# Patient Record
Sex: Male | Born: 1960 | Race: White | Hispanic: No | Marital: Single
Health system: Southern US, Community
[De-identification: ages and names within clinical notes are randomized; demographics above are authoritative.]

---

## 2019-08-09 ENCOUNTER — Emergency Department
Admission: EM | Admit: 2019-08-09 | Discharge: 2019-08-09 | Disposition: A | Discharge status: Home / Self Care | Payer: Medicare Other | Attending: Emergency Medicine | Admitting: Emergency Medicine

## 2019-08-09 ENCOUNTER — Encounter: Payer: Self-pay | Admitting: Emergency Medicine

## 2019-08-09 ENCOUNTER — Other Ambulatory Visit: Payer: Self-pay

## 2019-08-09 DIAGNOSIS — S21152A Open bite of left front wall of thorax without penetration into thoracic cavity, initial encounter: Secondary | ICD-10-CM | POA: Diagnosis not present

## 2019-08-09 DIAGNOSIS — Z23 Encounter for immunization: Secondary | ICD-10-CM | POA: Insufficient documentation

## 2019-08-09 DIAGNOSIS — Z203 Contact with and (suspected) exposure to rabies: Secondary | ICD-10-CM

## 2019-08-09 DIAGNOSIS — Y929 Unspecified place or not applicable: Secondary | ICD-10-CM | POA: Diagnosis not present

## 2019-08-09 DIAGNOSIS — Y999 Unspecified external cause status: Secondary | ICD-10-CM | POA: Diagnosis not present

## 2019-08-09 DIAGNOSIS — Z2914 Encounter for prophylactic rabies immune globin: Secondary | ICD-10-CM | POA: Insufficient documentation

## 2019-08-09 DIAGNOSIS — Y939 Activity, unspecified: Secondary | ICD-10-CM | POA: Diagnosis not present

## 2019-08-09 DIAGNOSIS — W540XXA Bitten by dog, initial encounter: Secondary | ICD-10-CM | POA: Diagnosis not present

## 2019-08-09 MED ORDER — RABIES VACCINE, PCEC IM SUSR
1.0000 mL | Freq: Once | INTRAMUSCULAR | Status: AC
  Administered 2019-08-09: 1 mL via INTRAMUSCULAR

## 2019-08-09 MED ORDER — RABIES IMMUNE GLOBULIN 150 UNIT/ML IM INJ
20.0000 [IU]/kg | INJECTION | Freq: Once | INTRAMUSCULAR | Status: AC
  Administered 2019-08-09: 2400 [IU] via INTRAMUSCULAR
  Filled 2019-08-09: qty 16

## 2019-08-09 MED ORDER — AMOXICILLIN-POT CLAVULANATE 875-125 MG PO TABS: 1.0000 | ORAL_TABLET | Freq: Two times a day (BID) | ORAL | 0 refills | Status: AC

## 2019-08-09 NOTE — ED Provider Notes (Signed)
[facility] Emergency Department Provider Note  ____________________________________________  Time seen: Approximately 7:36 PM  I have reviewed the triage vital signs and the nursing notes.   HISTORY  Chief Complaint Animal Bite    HPI Stephen Bray is a 58 y.o. male who presents the emergency department after being bitten by rabid dog yesterday.  Patient was informed by the health department and animal control that an animal that he was in contact with yesterday tested positive for rabies.  Patient states that roughly 30 years ago he had the rabies vaccine but he had had titers drawn in the interim and revealed no residual antibodies.  As such patient will require rabies series including immunoglobin today.  Tetanus shot is up-to-date.  Patient will be started on rabies series today.  Patient was bitten to the left anterior chest wall with superficial laceration.  Patient denies any evidence of infection to the chest wall         History reviewed. No pertinent past medical history.  There are no problems to display for this patient.   History reviewed. No pertinent surgical history.  Prior to Admission medications   Medication Sig Start Date End Date Taking? Authorizing Provider  amoxicillin-clavulanate (AUGMENTIN) 875-125 MG tablet Take 1 tablet by mouth 2 (two) times daily. 08/09/19   Jentry Mcqueary, Charline Bills, PA-C    Allergies Patient has no allergy information on record.  No family history on file.  Social History Social History   Tobacco Use  . Smoking status: Not on file  Substance Use Topics  . Alcohol use: Not on file  . Drug use: Not on file     Review of Systems  Constitutional: No fever/chills Eyes: No visual changes. No discharge ENT: No upper respiratory complaints. Cardiovascular: no chest pain. Respiratory: no cough. No SOB. Gastrointestinal: No abdominal pain.  No nausea, no vomiting.  No diarrhea.  No  constipation. Musculoskeletal: No musculoskeletal pain Skin:  Bite to the anterior left chest wall.  No surrounding erythema or edema.  Superficial abrasions noted.  No open laceration.  No bleeding or foreign body.  No tenderness to palpation of the underlying structure Neurological: Negative for headaches, focal weakness or numbness. 10-point ROS otherwise negative.  ____________________________________________   PHYSICAL EXAM:  VITAL SIGNS: ED Triage Vitals  Enc Vitals Group     BP 08/09/19 1851 (!) 205/110     Pulse Rate 08/09/19 1851 84     Resp 08/09/19 1851 20     Temp 08/09/19 1851 99.2 F (37.3 C)     Temp Source 08/09/19 1851 Oral     SpO2 08/09/19 1851 98 %     Weight 08/09/19 1833 263 lb (119.3 kg)     Height 08/09/19 1833 6' (1.829 m)     Head Circumference --      Peak Flow --      Pain Score 08/09/19 1833 0     Pain Loc --      Pain Edu? --      Excl. in [place]? --      Constitutional: Alert and oriented. Well appearing and in no acute distress. Eyes: Conjunctivae are normal. PERRL. EOMI. Head: Atraumatic. ENT:      Ears:       Nose: No congestion/rhinnorhea.      Mouth/Throat: Mucous membranes are moist.  Neck: No stridor.   Hematological/Lymphatic/Immunilogical: No cervical lymphadenopathy. Cardiovascular: Normal rate, regular rhythm. Normal S1 and S2.  Good peripheral circulation. Respiratory: Normal  respiratory effort without tachypnea or retractions. Lungs CTAB. Good air entry to the bases with no decreased or absent breath sounds. Musculoskeletal: Full range of motion to all extremities. No gross deformities appreciated. Neurologic:  Normal speech and language. No gross focal neurologic deficits are appreciated.  Skin:  Skin is warm, dry and intact. No rash noted. Bite wound to the anterior left chest wall.  No surrounding erythema or edema.  Superficial abrasions noted.  No open laceration.  No bleeding or foreign body.  No tenderness to palpation of the  underlying structure Psychiatric: Mood and affect are normal. Speech and behavior are normal. Patient exhibits appropriate insight and judgement.   ____________________________________________   LABS (all labs ordered are listed, but only abnormal results are displayed)  Labs Reviewed - No data to display ____________________________________________  EKG   ____________________________________________  RADIOLOGY   No results found.  ____________________________________________    PROCEDURES  Procedure(s) performed:    Procedures    Medications  rabies vaccine (RABAVERT) injection 1 mL (1 mL Intramuscular Given 08/09/19 2132)  rabies immune globulin (HYPERAB/KEDRAB) injection 2,400 Units (2,400 Units Intramuscular Given 08/09/19 2137)     ____________________________________________   INITIAL IMPRESSION / ASSESSMENT AND PLAN / ED COURSE  Pertinent labs & imaging results that were available during my care of the patient were reviewed by me and considered in my medical decision making (see chart for details).  Review of the [place] CSRS was performed in accordance of the [facility] prior to dispensing any controlled drugs.           Patient's diagnosis is consistent with dog bite from riding and will need the rabies vaccine series.  Patient presents emergency department after being bitten by a rabid animal yesterday.  Patient has a superficial wound to the left chest that does not have any evidence of infection.  Patient will receive rabies vaccine and immunoglobin today.  Patient had rabies series approximately 30 years ago but had titers which revealed no ongoing antibodies.  As such patient will receive full course of rabies series.  Tetanus shot is up-to-date.  Patient will be placed on antibiotics for animal bite.  Follow-up in 3 days for second rabies vaccine..  Patient is given ED precautions to return to the ED for any worsening or new  symptoms.     ____________________________________________  FINAL CLINICAL IMPRESSION(S) / ED DIAGNOSES  Final diagnoses:  Dog bite, initial encounter  Rabies contact  Need for post exposure prophylaxis for rabies      NEW MEDICATIONS STARTED DURING THIS VISIT:  ED Discharge Orders         Ordered    amoxicillin-clavulanate (AUGMENTIN) 875-125 MG tablet  2 times daily     08/09/19 2137              This chart was dictated using voice recognition software/Dragon. Despite best efforts to proofread, errors can occur which can change the meaning. Any change was purely unintentional.    Racheal Patches, PA-C 08/09/19 2141    Sharman Cheek, MD 08/09/19 [phone]

## 2019-08-09 NOTE — ED Triage Notes (Signed)
Pt reports was called by animal control and told he was exposed and to come to the ED. Pt reports he was licked and bit.

## 2019-08-09 NOTE — ED Notes (Signed)
Patient reports went to the house to help the neighbors try to get dog out the house, reports being bitten by dog, reports had the dogs blood and saliva all over him.

## 2020-05-01 ENCOUNTER — Other Ambulatory Visit: Payer: Self-pay | Admitting: Adult Health

## 2020-05-01 DIAGNOSIS — Z0189 Encounter for other specified special examinations: Secondary | ICD-10-CM

## 2020-05-09 ENCOUNTER — Other Ambulatory Visit: Payer: Self-pay

## 2020-05-09 ENCOUNTER — Ambulatory Visit
Admission: RE | Admit: 2020-05-09 | Discharge: 2020-05-09 | Disposition: A | Discharge status: Home / Self Care | Payer: No Typology Code available for payment source | Source: Ambulatory Visit | Attending: Adult Health | Admitting: Adult Health

## 2020-05-09 DIAGNOSIS — Z0189 Encounter for other specified special examinations: Secondary | ICD-10-CM | POA: Diagnosis not present

## 2021-05-11 IMAGING — US US ABDOMEN COMPLETE
1 series · 13 of 25 positions shown · non-contrast
Comparison: None.

CLINICAL DATA: Hepatitis, HCC surveillance

EXAM:
ABDOMEN ULTRASOUND COMPLETE

[Series 1: us abdomen complete · 13 of 102 slices shown]
[im 1/102]
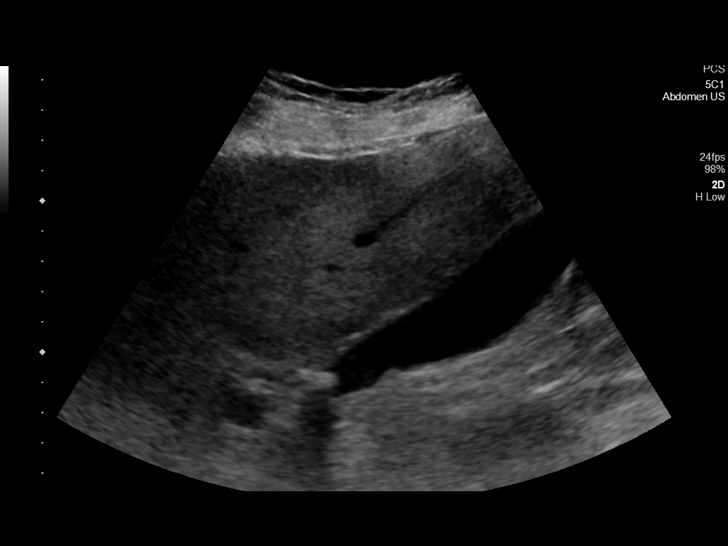
[im 9/102]
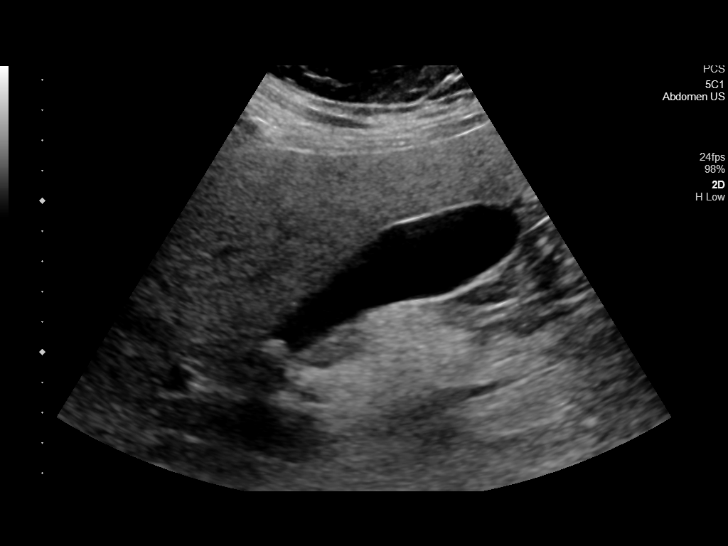
[im 17/102]
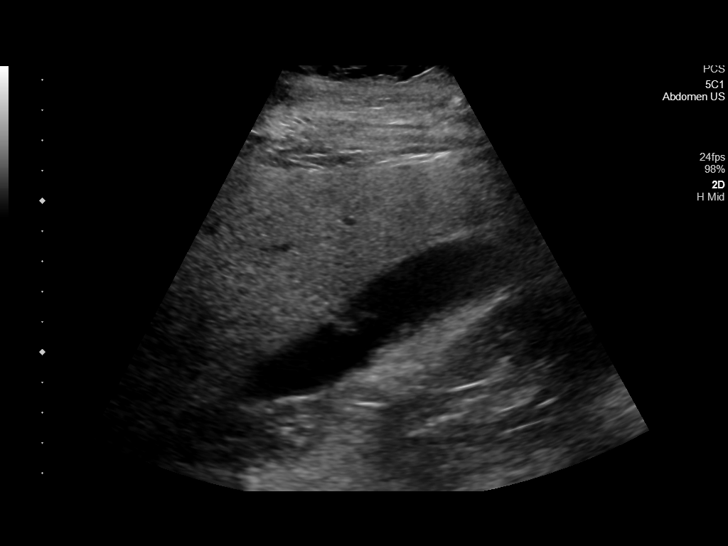
[im 26/102]
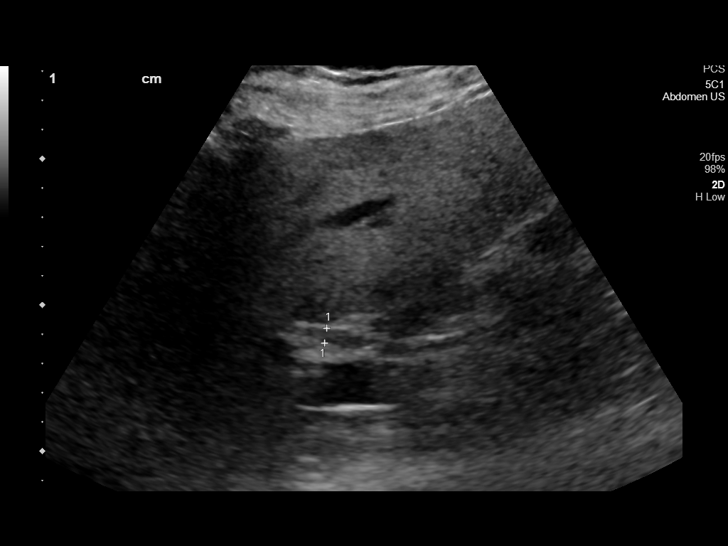
[im 34/102]
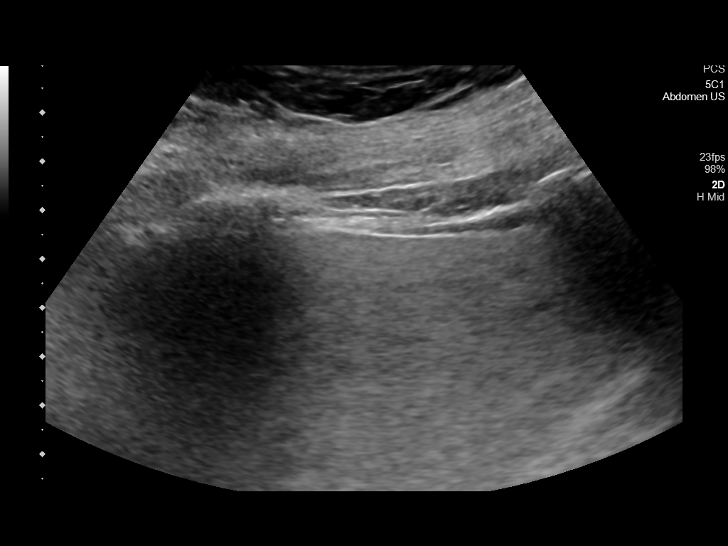
[im 43/102]
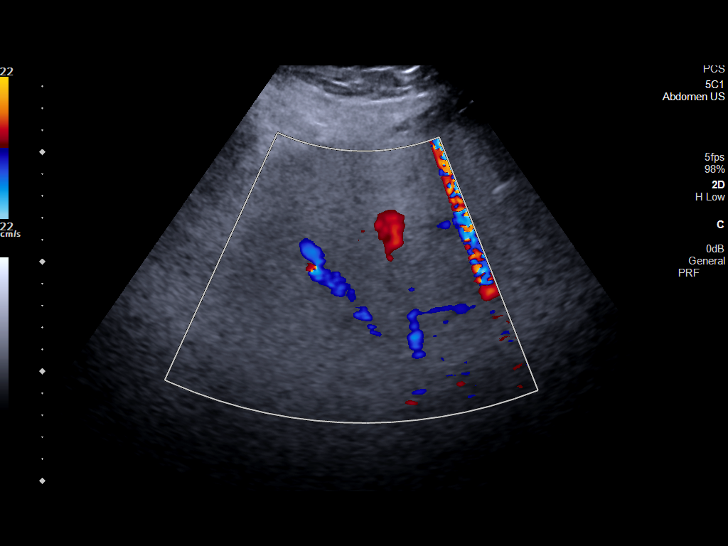
[im 51/102]
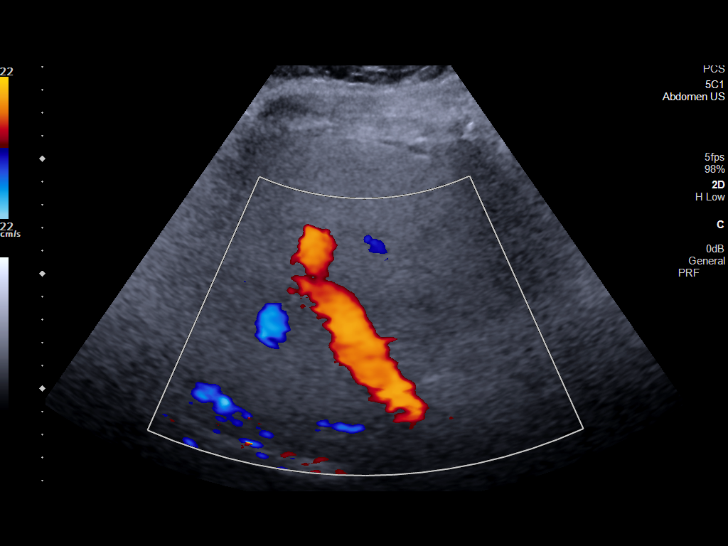
[im 59/102]
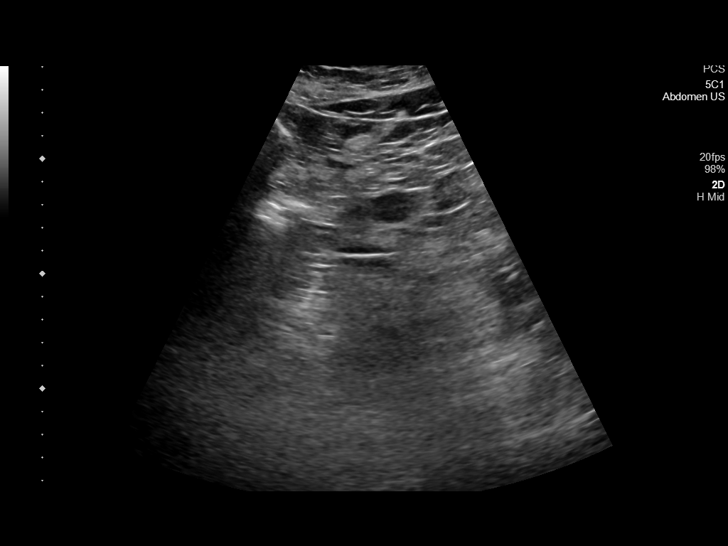
[im 68/102]
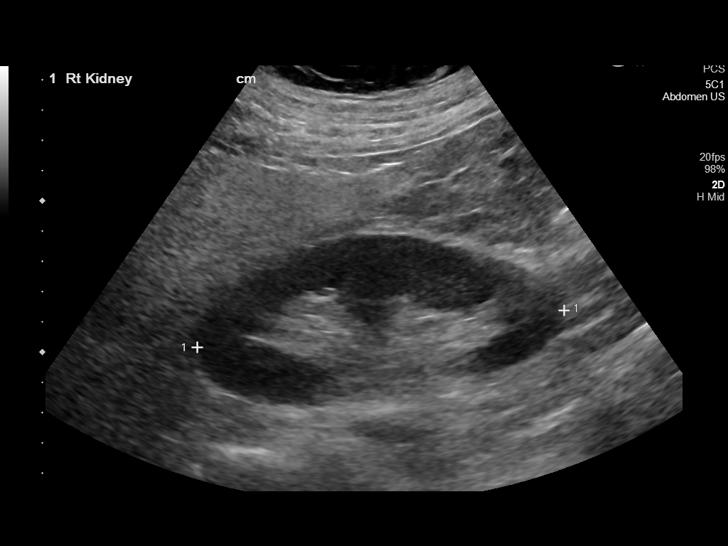
[im 76/102]
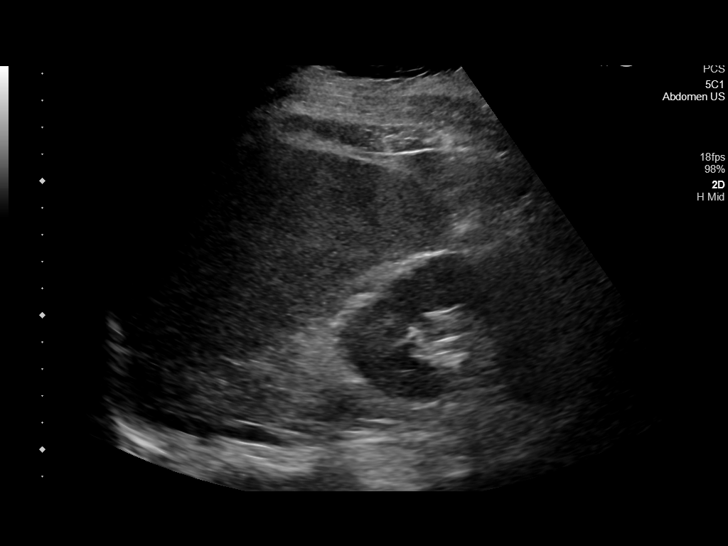
[im 85/102]
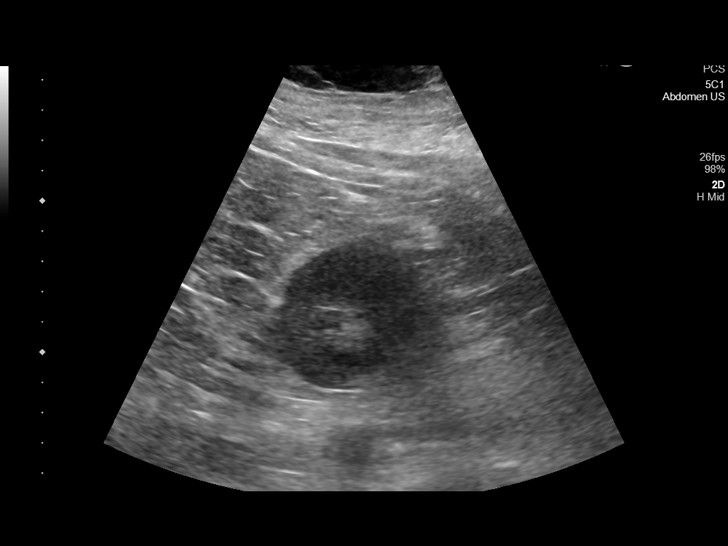
[im 93/102]
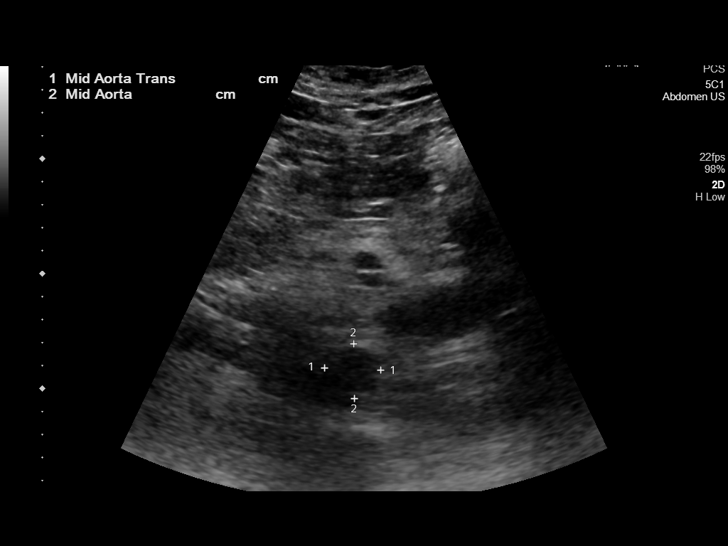
[im 102/102]
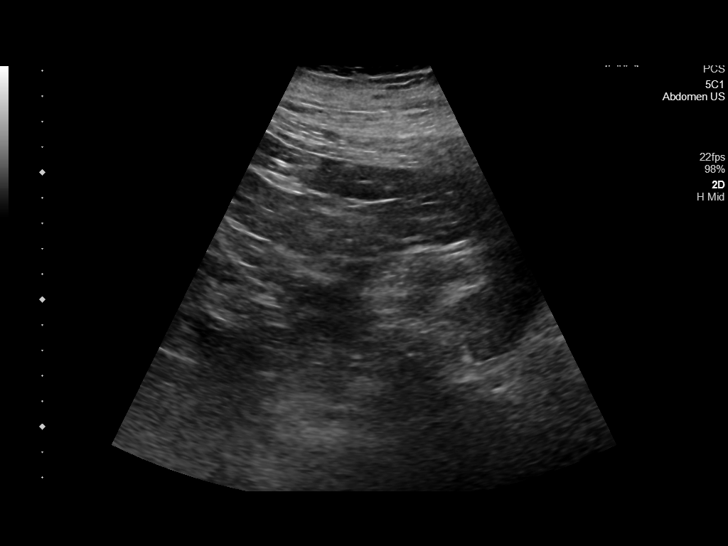

[13 of 25 positions shown; findings below may reference images not displayed]

FINDINGS: Gallbladder: Echogenic shadowing gallstone in the neck region
measuring 1.2 cm. Additional gallbladder neck stones noted. Small
subcentimeter polyps or cholesterolosis of the anterior wall noted
measuring 3 mm. Normal wall thickness measuring 2 mm. No Murphy's
sign or pericholecystic fluid. No signs of cholecystitis.

Common bile duct: Diameter: 5 mm

Liver: Increased echogenicity. This limits evaluation of the liver
parenchyma. No large focal abnormality appreciated by ultrasound. No
intrahepatic biliary dilatation. Portal vein is patent on color
Doppler imaging with normal direction of blood flow towards the
liver.

IVC: Proximal IVC is visualized and patent.

Pancreas: Obscured by bowel gas.

Spleen: 13.3 cm length.  Splenic volume estimated at 768 cc.

Right Kidney: Length: 12.2 cm. Echogenicity within normal limits. No
mass or hydronephrosis visualized.

Left Kidney: Length: 11.7 cm. Echogenicity within normal limits. No
mass or hydronephrosis visualized.

Abdominal aorta: No aneurysm visualized.

Other findings: No free fluid or ascites
IMPRESSION: Cholelithiasis without signs of cholecystitis.

No biliary dilatation

Hepatic steatosis.  No large focal hepatic abnormality.

Nonvisualization of the pancreas
